# Patient Record
Sex: Male | Born: 1995 | Race: Black or African American | Hispanic: No | Marital: Single | Smoking: Never smoker
Health system: Southern US, Community
[De-identification: ages and names within clinical notes are randomized; demographics above are authoritative.]

---
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
## 2016-06-12 ENCOUNTER — Emergency Department (HOSPITAL_COMMUNITY)
Admission: EM | Admit: 2016-06-12 | Discharge: 2016-06-12 | Disposition: A | Discharge status: Home / Self Care | Payer: Medicaid Other | Attending: Emergency Medicine | Admitting: Emergency Medicine

## 2016-06-12 ENCOUNTER — Emergency Department (HOSPITAL_COMMUNITY): Payer: Medicaid Other

## 2016-06-12 ENCOUNTER — Encounter (HOSPITAL_COMMUNITY): Payer: Self-pay | Admitting: Emergency Medicine

## 2016-06-12 DIAGNOSIS — Y999 Unspecified external cause status: Secondary | ICD-10-CM | POA: Insufficient documentation

## 2016-06-12 DIAGNOSIS — Y92410 Unspecified street and highway as the place of occurrence of the external cause: Secondary | ICD-10-CM | POA: Diagnosis not present

## 2016-06-12 DIAGNOSIS — R51 Headache: Secondary | ICD-10-CM | POA: Diagnosis present

## 2016-06-12 DIAGNOSIS — R079 Chest pain, unspecified: Secondary | ICD-10-CM | POA: Insufficient documentation

## 2016-06-12 DIAGNOSIS — Y939 Activity, unspecified: Secondary | ICD-10-CM | POA: Insufficient documentation

## 2016-06-12 MED ORDER — IBUPROFEN 800 MG PO TABS: 800.0000 mg | ORAL_TABLET | Freq: Three times a day (TID) | ORAL | 0 refills | Status: DC | PRN

## 2016-06-12 MED ORDER — METHOCARBAMOL 500 MG PO TABS: 500.0000 mg | ORAL_TABLET | Freq: Three times a day (TID) | ORAL | 0 refills | Status: AC | PRN

## 2016-06-12 NOTE — ED Notes (Signed)
Pt ambulatory and independent at discharge.  Verbalized understanding of discharge instructions and importance of not driving while under the influence of mind altering medications.
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
## 2016-06-12 NOTE — Discharge Instructions (Signed)
Read the information below.  Use the prescribed medication as directed.  Please discuss all new medications with your pharmacist.  You may return to the Emergency Department at any time for worsening condition or any new symptoms that concern you.   If you develop worsening chest pain, shortness of breath, fever, you pass out, or become weak or dizzy, return to the ER for a recheck.    °

## 2016-06-12 NOTE — ED Notes (Signed)
Bed: WTR8 Expected date:  Expected time:  Means of arrival:  Comments: 
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
## 2017-11-14 ENCOUNTER — Encounter (HOSPITAL_COMMUNITY): Payer: Self-pay | Admitting: Emergency Medicine

## 2017-11-14 ENCOUNTER — Other Ambulatory Visit: Payer: Self-pay

## 2017-11-14 ENCOUNTER — Ambulatory Visit (HOSPITAL_COMMUNITY)
Admission: EM | Admit: 2017-11-14 | Discharge: 2017-11-14 | Disposition: A | Discharge status: Home / Self Care | Payer: Self-pay | Attending: Family Medicine | Admitting: Family Medicine

## 2017-11-14 DIAGNOSIS — M545 Low back pain, unspecified: Secondary | ICD-10-CM

## 2017-11-14 MED ORDER — DICLOFENAC SODIUM 75 MG PO TBEC: 75.0000 mg | DELAYED_RELEASE_TABLET | Freq: Two times a day (BID) | ORAL | 0 refills | Status: AC

## 2017-11-14 MED ORDER — PREDNISONE 10 MG (21) PO TBPK: ORAL_TABLET | Freq: Every day | ORAL | 0 refills | Status: AC

## 2017-11-14 NOTE — ED Provider Notes (Signed)
[facility]   [id number] 11/14/17 Arrival Time: [id number]  ASSESSMENT & PLAN:  1. Acute left-sided low back pain without sciatica     Meds ordered this encounter  Medications  . diclofenac (VOLTAREN) 75 MG EC tablet    Sig: Take 1 tablet (75 mg total) by mouth 2 (two) times daily.    Dispense:  14 tablet    Refill:  0  . predniSONE (STERAPRED UNI-PAK 21 TAB) 10 MG (21) TBPK tablet    Sig: Take by mouth daily. Take as directed.    Dispense:  21 tablet    Refill:  0   Encouraged ROM. Reviewed expectations re: course of current medical issues. Questions answered. Outlined signs and symptoms indicating need for more acute intervention. Patient verbalized understanding. After Visit Summary given.   SUBJECTIVE: History from: patient.  Dakota Sherman is a 22 y.o. male who presents with complaint of intermittent left sided lower back/buttock area discomfort. Onset gradual beginning a few weeks ago. Injury/trama: no. History of back problems: no. Previous back surgery: no. Discomfort described as aching with occasional radiation to L posterior thigh. Certain movements exacerbate the described discomfort. Better with rest. Extremity sensation changes or weakness: none. Ambulatory without difficulty. Normal bowel/bladder habits. No associated abdominal pain/n/v. Self treatment: tried OTCs without relief of pain.  Patient reports no fevers, IV drug use, recent back surgeries or procedures, urinary incontinence, or bowel incontinence.  ROS: As per HPI.   OBJECTIVE:  Vitals:   11/14/17 1017  BP: 116/73  Pulse: 69  Resp: 18  Temp: 98.1 F (36.7 C)  TempSrc: Oral  SpO2: 100%    General appearance: alert; no distress Neck: supple with FROM; without midline tenderness Lungs: unlabored respirations; symmetrical air entry Abdomen: soft, non-tender; bowel sounds normal; no masses or organomegaly; no guarding or rebound tenderness Back: left sided lower mild tenderness present over  SI joint distribution; FROM at hips with mild discomfort reported; bruising: none; without midline tenderness Extremities: no cyanosis or edema; symmetrical with no gross deformities Skin: warm and dry Neurologic: normal gait; normal symmetric reflexes; normal LE strength and sensation Psychological: alert and cooperative; normal mood and affect  No Known Allergies  Social History   Socioeconomic History  . Marital status: Single    Spouse name: Not on file  . Number of children: Not on file  . Years of education: Not on file  . Highest education level: Not on file  Occupational History  . Not on file  Social Needs  . Financial resource strain: Not on file  . Food insecurity:    Worry: Not on file    Inability: Not on file  . Transportation needs:    Medical: Not on file    Non-medical: Not on file  Tobacco Use  . Smoking status: Never Smoker  . Smokeless tobacco: Never Used  Substance and Sexual Activity  . Alcohol use: No  . Drug use: Not on file  . Sexual activity: Not on file  Lifestyle  . Physical activity:    Days per week: Not on file    Minutes per session: Not on file  . Stress: Not on file  Relationships  . Social connections:    Talks on phone: Not on file    Gets together: Not on file    Attends religious service: Not on file    Active member of club or organization: Not on file    Attends meetings of clubs or organizations: Not on file  Relationship status: Not on file  . Intimate partner violence:    Fear of current or ex partner: Not on file    Emotionally abused: Not on file    Physically abused: Not on file    Forced sexual activity: Not on file  Other Topics Concern  . Not on file  Social History Narrative  . Not on file   No past surgical history on file.   Mardella Layman, MD 11/14/17 1025

## 2017-11-14 NOTE — ED Triage Notes (Signed)
Seen by provider

## 2017-11-14 NOTE — ED Notes (Signed)
Seen  by provider only

## 2017-11-14 NOTE — Discharge Instructions (Signed)

## 2018-03-01 ENCOUNTER — Emergency Department (HOSPITAL_COMMUNITY)
Admission: EM | Admit: 2018-03-01 | Discharge: 2018-03-01 | Disposition: A | Discharge status: Home / Self Care | Payer: Self-pay | Attending: Emergency Medicine | Admitting: Emergency Medicine

## 2018-03-01 ENCOUNTER — Encounter (HOSPITAL_COMMUNITY): Payer: Self-pay | Admitting: Emergency Medicine

## 2018-03-01 ENCOUNTER — Other Ambulatory Visit: Payer: Self-pay

## 2018-03-01 DIAGNOSIS — T675XXA Heat exhaustion, unspecified, initial encounter: Secondary | ICD-10-CM

## 2018-03-01 DIAGNOSIS — X30XXXA Exposure to excessive natural heat, initial encounter: Secondary | ICD-10-CM | POA: Insufficient documentation

## 2018-03-01 DIAGNOSIS — E860 Dehydration: Secondary | ICD-10-CM | POA: Insufficient documentation

## 2018-03-01 DIAGNOSIS — Z79899 Other long term (current) drug therapy: Secondary | ICD-10-CM | POA: Insufficient documentation

## 2018-03-01 LAB — COMPREHENSIVE METABOLIC PANEL
ALBUMIN: 4.7 g/dL (ref 3.5–5.0)
ALT: 19 U/L (ref 0–44)
AST: 22 U/L (ref 15–41)
Alkaline Phosphatase: 73 U/L (ref 38–126)
Anion gap: 10 (ref 5–15)
BUN: 11 mg/dL (ref 6–20)
CHLORIDE: 104 mmol/L (ref 98–111)
CO2: 26 mmol/L (ref 22–32)
Calcium: 10 mg/dL (ref 8.9–10.3)
Creatinine, Ser: 0.97 mg/dL (ref 0.61–1.24)
GFR calc Af Amer: >60 mL/min (ref >60)
GFR calc non Af Amer: >60 mL/min (ref >60)
GLUCOSE: 73 mg/dL (ref 70–99)
Potassium: 3.7 mmol/L (ref 3.5–5.1)
SODIUM: 140 mmol/L (ref 135–145)
Total Bilirubin: 2.1 mg/dL — ABNORMAL HIGH (ref 0.3–1.2)
Total Protein: 7.6 g/dL (ref 6.5–8.1)

## 2018-03-01 LAB — URINALYSIS, ROUTINE W REFLEX MICROSCOPIC
Bacteria, UA: NONE SEEN
Bilirubin Urine: NEGATIVE
GLUCOSE, UA: NEGATIVE mg/dL
Hgb urine dipstick: NEGATIVE
Ketones, ur: 20 mg/dL — AB
Leukocytes, UA: NEGATIVE
Nitrite: NEGATIVE
PH: 5 (ref 5.0–8.0)
Protein, ur: 100 mg/dL — AB
SPECIFIC GRAVITY, URINE: 1.036 — AB (ref 1.005–1.030)

## 2018-03-01 LAB — LIPASE, BLOOD: LIPASE: 28 U/L (ref 11–51)

## 2018-03-01 LAB — CBC
HEMATOCRIT: 48.5 % (ref 39.0–52.0)
Hemoglobin: 15.9 g/dL (ref 13.0–17.0)
MCH: 28.8 pg (ref 26.0–34.0)
MCHC: 32.8 g/dL (ref 30.0–36.0)
MCV: 87.7 fL (ref 78.0–100.0)
PLATELETS: 241 10*3/uL (ref 150–400)
RBC: 5.53 MIL/uL (ref 4.22–5.81)
RDW: 11.7 % (ref 11.5–15.5)
WBC: 10.3 10*3/uL (ref 4.0–10.5)

## 2018-03-01 MED ORDER — SODIUM CHLORIDE 0.9 % IV BOLUS
1000.0000 mL | Freq: Once | INTRAVENOUS | Status: AC
  Administered 2018-03-01: 1000 mL via INTRAVENOUS

## 2018-03-01 MED ORDER — ONDANSETRON 4 MG PO TBDP: ORAL_TABLET | ORAL | 0 refills | Status: AC

## 2018-03-01 NOTE — Discharge Instructions (Signed)
Stay well-hydrated, avoid soda/sugary drinks.  If you were given medicines take as directed.  If you are on coumadin or contraceptives realize their levels and effectiveness is altered by many different medicines.  If you have any reaction (rash, tongues swelling, other) to the medicines stop taking and see a physician.    If your blood pressure was elevated in the ER make sure you follow up for management with a primary doctor or return for chest pain, shortness of breath or stroke symptoms.  Please follow up as directed and return to the ER or see a physician for new or worsening symptoms.  Thank you. Vitals:   03/01/18 2038 03/01/18 2115  BP: 133/81 123/77  Pulse: 71 74  Resp: 16   Temp: 98.9 F (37.2 C)   TempSrc: Oral   SpO2: 100% 100%  Weight: 63.5 kg (140 lb)   Height: 6\' 2"  (1.88 m)

## 2018-03-01 NOTE — ED Notes (Signed)
ED Provider at bedside. 
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
## 2018-03-01 NOTE — ED Provider Notes (Signed)
MSE was initiated and I personally evaluated the patient and placed orders (if any) at  8:41 PM on March 01, 2018.  The patient appears stable so that the remainder of the MSE may be completed by another provider.  Patient placed in Quick Look pathway, seen and evaluated   Chief Complaint: Vomiting, body aches, abdominal pain  HPI:   Patient presents to ED for evaluation of acute onset generalized abdominal pain, one episode of nonbloody, nonbilious emesis, body aches, headache.  Symptoms began while he was at work.  Denies any suspicious food ingestions prior to symptoms.  No sick contacts with similar symptoms.  Denies any changes in bowel movements.  Denies any urinary symptoms.  Denies prior abdominal surgeries, shortness of breath, fever.  He denies alcohol, tobacco or other drug use.  Does report daily ibuprofen use for sciatica.  ROS: Vomiting  Physical Exam:   Gen: No distress  Neuro: Awake and Alert  Skin: Warm    Focused Exam: No abdominal tenderness to palpation.  Does not appear dehydrated.   Initiation of care has begun. The patient has been counseled on the process, plan, and necessity for staying for the completion/evaluation, and the remainder of the medical screening examination    Dietrich PatesKhatri, [place] Hogsett, PA-C 03/01/18 2042    Gerhard MunchLockwood, Robert, MD 03/01/18 2253

## 2018-03-01 NOTE — ED Notes (Signed)
Pt able to keep PO fluids down 

## 2018-03-01 NOTE — ED Triage Notes (Signed)
Pt reports one episode of vomiting today and headaches. Reports he took a motrin and that helped with pain. Reports hasn't  Been able to eat today, prompting his visit to ED.

## 2020-02-12 ENCOUNTER — Other Ambulatory Visit: Payer: Self-pay

## 2020-02-12 DIAGNOSIS — Y9289 Other specified places as the place of occurrence of the external cause: Secondary | ICD-10-CM | POA: Diagnosis not present

## 2020-02-12 DIAGNOSIS — Y999 Unspecified external cause status: Secondary | ICD-10-CM | POA: Insufficient documentation

## 2020-02-12 DIAGNOSIS — X58XXXA Exposure to other specified factors, initial encounter: Secondary | ICD-10-CM | POA: Diagnosis not present

## 2020-02-12 DIAGNOSIS — T1591XA Foreign body on external eye, part unspecified, right eye, initial encounter: Secondary | ICD-10-CM | POA: Diagnosis not present

## 2020-02-12 DIAGNOSIS — S0501XA Injury of conjunctiva and corneal abrasion without foreign body, right eye, initial encounter: Secondary | ICD-10-CM | POA: Diagnosis not present

## 2020-02-12 DIAGNOSIS — Y9389 Activity, other specified: Secondary | ICD-10-CM | POA: Diagnosis not present

## 2020-02-12 DIAGNOSIS — H5711 Ocular pain, right eye: Secondary | ICD-10-CM | POA: Diagnosis present

## 2020-02-12 NOTE — ED Triage Notes (Signed)
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 Redness noted to right eye.

## 2020-02-13 ENCOUNTER — Emergency Department
Admission: EM | Admit: 2020-02-13 | Discharge: 2020-02-13 | Disposition: A | Discharge status: Home / Self Care | Payer: Worker's Compensation | Attending: Emergency Medicine | Admitting: Emergency Medicine

## 2020-02-13 DIAGNOSIS — T1591XA Foreign body on external eye, part unspecified, right eye, initial encounter: Secondary | ICD-10-CM

## 2020-02-13 DIAGNOSIS — S0501XA Injury of conjunctiva and corneal abrasion without foreign body, right eye, initial encounter: Secondary | ICD-10-CM

## 2020-02-13 MED ORDER — CIPROFLOXACIN HCL 0.3 % OP SOLN
2.0000 [drp] | Freq: Once | OPHTHALMIC | Status: AC
  Administered 2020-02-13: 2 [drp] via OPHTHALMIC
  Filled 2020-02-13: qty 2.5

## 2020-02-13 MED ORDER — FLUORESCEIN SODIUM 1 MG OP STRP
1.0000 | ORAL_STRIP | Freq: Once | OPHTHALMIC | Status: AC
  Administered 2020-02-13: 1 via OPHTHALMIC

## 2020-02-13 MED ORDER — TETRACAINE HCL 0.5 % OP SOLN
2.0000 [drp] | Freq: Once | OPHTHALMIC | Status: AC
  Administered 2020-02-13: 2 [drp] via OPHTHALMIC

## 2020-02-13 MED ORDER — CIPROFLOXACIN HCL 0.3 % OP SOLN: 1.0000 [drp] | OPHTHALMIC | 0 refills | Status: AC

## 2020-02-13 NOTE — ED Provider Notes (Signed)
[facility] Emergency Department Provider Note  ____________________________________________   First MD Initiated Contact with Patient 02/13/20 [phone]     (approximate)  I have reviewed the triage vital signs and the nursing notes.   HISTORY  Chief Complaint Eye Pain   HPI Dakota Sherman is a 24 y.o. male presents to the emergency department secondary to concern for metallic foreign body in the right eye.  Patient believes there is metal shavings in his eye.  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  Patient states that he attempted to wash it out of his eye as well as scrape it out of his eye with his finger.        No past medical history on file.  There are no problems to display for this patient.   No past surgical history on file.  Prior to Admission medications   Medication Sig Start Date End Date Taking? Authorizing Provider  diclofenac (VOLTAREN) 75 MG EC tablet Take 1 tablet (75 mg total) by mouth 2 (two) times daily. 11/14/17   Mardella Layman, MD  methocarbamol (ROBAXIN) 500 MG tablet Take 1-2 tablets (500-1,000 mg total) by mouth every 8 (eight) hours as needed for muscle spasms (and pain). 06/12/16   Trixie Dredge, PA-C  ondansetron (ZOFRAN ODT) 4 MG disintegrating tablet 4mg  ODT q4 hours prn nausea/vomit 03/01/18   05/02/18, MD  predniSONE (STERAPRED UNI-PAK 21 TAB) 10 MG (21) TBPK tablet Take by mouth daily. Take as directed. 11/14/17   11/16/17, MD    Allergies Patient has no known allergies.  No family history on file.  Social History Social History   Tobacco Use  . Smoking status: Never Smoker  . Smokeless tobacco: Never Used  Substance Use Topics  . Alcohol use: No  . Drug use: Not on file    Review of Systems Constitutional: No fever/chills Eyes: No visual changes.  Positive for right eye foreign body. ENT: No sore throat. Cardiovascular: Denies chest  pain. Respiratory: Denies shortness of breath. Gastrointestinal: No abdominal pain.  No nausea, no vomiting.  No diarrhea.  No constipation. Genitourinary: Negative for dysuria. Musculoskeletal: Negative for neck pain.  Negative for back pain. Integumentary: Negative for rash. Neurological: Negative for headaches, focal weakness or numbness.   ____________________________________________   PHYSICAL EXAM:  VITAL SIGNS: ED Triage Vitals  Enc Vitals Group     BP 02/12/20 2355 131/70     Pulse Rate 02/12/20 2355 (!) 57     Resp 02/12/20 2355 18     Temp 02/12/20 2355 (!) 97.4 F (36.3 C)     Temp Source 02/12/20 2355 Oral     SpO2 02/12/20 2355 100 %     Weight 02/12/20 2337 68 kg (150 lb)     Height 02/12/20 2337 1.88 m (6\' 2" )     Head Circumference --      Peak Flow --      Pain Score 02/12/20 2337 2     Pain Loc --      Pain Edu? --      Excl. in GC? --     Constitutional: Alert and oriented.  Eyes: Conjunctivae are normal.  Metallic foreign body noted at the 3 o'clock position of the right eye with adjacent corneal abrasion Head: Atraumatic. Mouth/Throat: Patient is wearing a mask. Neck: No stridor.  No meningeal signs.   Cardiovascular: Normal rate, regular rhythm. Good peripheral circulation. Grossly  normal heart sounds. Respiratory: Normal respiratory effort.  No retractions. Neurologic:  Normal speech and language. No gross focal neurologic deficits are appreciated.  Skin:  Skin is warm, dry and intact. Psychiatric: Mood and affect are normal. Speech and behavior are normal.  ____________________________________________  Procedures   ____________________________________________   INITIAL IMPRESSION / MDM / ASSESSMENT AND PLAN / ED COURSE  As part of my medical decision making, I reviewed the following data within the electronic MEDICAL RECORD NUMBER  24 year old male presented with above-stated history and physical exam with a metallic foreign body noted in  the right eye as well as a corneal abrasion.  Cipro ophthalmic introduced into the eye.  Patient strongly advised to follow-up with Dr. Neville Route ophthalmology this morning  ____________________________________________  FINAL CLINICAL IMPRESSION(S) / ED DIAGNOSES  Final diagnoses:  Abrasion of right cornea, initial encounter  Foreign body of right eye, initial encounter     MEDICATIONS GIVEN DURING THIS VISIT:  Medications  ciprofloxacin (CILOXAN) 0.3 % ophthalmic solution 2 drop (has no administration in time range)  tetracaine (PONTOCAINE) 0.5 % ophthalmic solution 2 drop (2 drops Right Eye Given 02/13/20 0333)  fluorescein ophthalmic strip 1 strip (1 strip Right Eye Given 02/13/20 0334)     ED Discharge Orders    None      *Please note:  Dakota Sherman was evaluated in Emergency Department on 02/13/2020 for the symptoms described in the history of present illness. He was evaluated in the context of the global COVID-19 pandemic, which necessitated consideration that the patient might be at risk for infection with the SARS-CoV-2 virus that causes COVID-19. Institutional protocols and algorithms that pertain to the evaluation of patients at risk for COVID-19 are in a state of rapid change based on information released by regulatory bodies including the CDC and federal and state organizations. These policies and algorithms were followed during the patient's care in the ED.  Some ED evaluations and interventions may be delayed as a result of limited staffing during and after the pandemic.*  Note:  This document was prepared using Dragon voice recognition software and may include unintentional dictation errors.   Gregor Hams, MD 02/13/20 [phone]

## 2023-02-21 DIAGNOSIS — Z791 Long term (current) use of non-steroidal anti-inflammatories (NSAID): Secondary | ICD-10-CM | POA: Diagnosis not present
